# Patient Record
Sex: Male | Born: 1995 | Race: White | Hispanic: No | Marital: Single | Smoking: Current every day smoker
Health system: Southern US, Community
[De-identification: ages and names within clinical notes are randomized; demographics above are authoritative.]

## PROBLEM LIST (undated history)

## (undated) DIAGNOSIS — J45909 Unspecified asthma, uncomplicated: Secondary | ICD-10-CM

## (undated) HISTORY — PX: TYMPANOPLASTY: SHX33

## (undated) HISTORY — PX: TONSILLECTOMY: SUR1361

---

## 2013-02-20 ENCOUNTER — Emergency Department: Payer: Self-pay | Admitting: Emergency Medicine

## 2013-02-20 LAB — CBC
HCT: 47.6 % (ref 40.0–52.0)
MCH: 32 pg (ref 26.0–34.0)
MCV: 92 fL (ref 80–100)
Platelet: 324 10*3/uL (ref 150–440)
RBC: 5.15 10*6/uL (ref 4.40–5.90)
RDW: 12.3 % (ref 11.5–14.5)

## 2013-02-20 LAB — DRUG SCREEN, URINE
Amphetamines, Ur Screen: NEGATIVE (ref ?–1000)
Barbiturates, Ur Screen: NEGATIVE (ref ?–200)
Benzodiazepine, Ur Scrn: NEGATIVE (ref ?–200)
Cannabinoid 50 Ng, Ur ~~LOC~~: POSITIVE (ref ?–50)
Cocaine Metabolite,Ur ~~LOC~~: POSITIVE (ref ?–300)
MDMA (Ecstasy)Ur Screen: NEGATIVE (ref ?–500)
Methadone, Ur Screen: NEGATIVE (ref ?–300)
Phencyclidine (PCP) Ur S: NEGATIVE (ref ?–25)
Tricyclic, Ur Screen: NEGATIVE (ref ?–1000)

## 2013-02-20 LAB — ETHANOL
Ethanol %: 0.25 % — ABNORMAL HIGH
Ethanol: 250 mg/dL

## 2013-02-20 LAB — BASIC METABOLIC PANEL
BUN: 10 mg/dL (ref 9–21)
Chloride: 104 mmol/L (ref 97–107)
Co2: 25 mmol/L (ref 16–25)
Creatinine: 0.76 mg/dL (ref 0.60–1.30)
Glucose: 135 mg/dL — ABNORMAL HIGH (ref 65–99)
Osmolality: 275 (ref 275–301)
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
## 2015-06-11 ENCOUNTER — Emergency Department
Admission: EM | Admit: 2015-06-11 | Discharge: 2015-06-11 | Disposition: A | Discharge status: Home / Self Care | Payer: BLUE CROSS/BLUE SHIELD | Attending: Emergency Medicine | Admitting: Emergency Medicine

## 2015-06-11 ENCOUNTER — Encounter: Payer: Self-pay | Admitting: Emergency Medicine

## 2015-06-11 DIAGNOSIS — K922 Gastrointestinal hemorrhage, unspecified: Secondary | ICD-10-CM | POA: Insufficient documentation

## 2015-06-11 DIAGNOSIS — F17210 Nicotine dependence, cigarettes, uncomplicated: Secondary | ICD-10-CM | POA: Insufficient documentation

## 2015-06-11 DIAGNOSIS — K625 Hemorrhage of anus and rectum: Secondary | ICD-10-CM | POA: Diagnosis present

## 2015-06-11 HISTORY — DX: Unspecified asthma, uncomplicated: J45.909

## 2015-06-11 LAB — COMPREHENSIVE METABOLIC PANEL
ALT: 27 U/L (ref 17–63)
AST: 29 U/L (ref 15–41)
Albumin: 4.4 g/dL (ref 3.5–5.0)
Alkaline Phosphatase: 67 U/L (ref 38–126)
Anion gap: 5 (ref 5–15)
BILIRUBIN TOTAL: 0.5 mg/dL (ref 0.3–1.2)
BUN: 9 mg/dL (ref 6–20)
CALCIUM: 9.8 mg/dL (ref 8.9–10.3)
CO2: 29 mmol/L (ref 22–32)
CREATININE: 0.7 mg/dL (ref 0.61–1.24)
Chloride: 106 mmol/L (ref 101–111)
GFR calc Af Amer: >60 mL/min (ref >60)
Glucose, Bld: 103 mg/dL — ABNORMAL HIGH (ref 65–99)
Potassium: 3.3 mmol/L — ABNORMAL LOW (ref 3.5–5.1)
Sodium: 140 mmol/L (ref 135–145)
TOTAL PROTEIN: 7.5 g/dL (ref 6.5–8.1)

## 2015-06-11 LAB — CBC
HCT: 45.3 % (ref 40.0–52.0)
Hemoglobin: 15.9 g/dL (ref 13.0–18.0)
MCH: 31.7 pg (ref 26.0–34.0)
MCHC: 35.2 g/dL (ref 32.0–36.0)
MCV: 90.2 fL (ref 80.0–100.0)
PLATELETS: 309 10*3/uL (ref 150–440)
RBC: 5.03 MIL/uL (ref 4.40–5.90)
RDW: 12.6 % (ref 11.5–14.5)
WBC: 10 10*3/uL (ref 3.8–10.6)

## 2015-06-11 LAB — LIPASE, BLOOD: LIPASE: 40 U/L (ref 11–51)

## 2015-06-11 MED ORDER — PANTOPRAZOLE SODIUM 40 MG PO TBEC: 40.0000 mg | DELAYED_RELEASE_TABLET | Freq: Every day | ORAL | Status: AC

## 2015-06-11 NOTE — ED Notes (Addendum)
Patient ambulatory to triage with steady gait, without difficulty or distress noted; pt reports blood in stool today x 2 with "burning" to stomach; denies hx of same

## 2015-06-11 NOTE — ED Notes (Signed)
AAOx3.  Skin warm and dry.  NAD 

## 2015-06-11 NOTE — ED Provider Notes (Signed)
[facility] Emergency Department Provider Note    ____________________________________________  Time seen: 2140  I have reviewed the triage vital signs and the nursing notes.   HISTORY  Chief Complaint Rectal Bleeding   History limited by: Not Limited   HPI Eddie Willis is a 19 y.o. male who presents to the emergency department today because of concerns for rectal bleeding. The patient states he had 2 bowel movements today with some red blood. He thinks that the red blood might of been mixed in with the stool. He denied any excessive straining with his bowel movements today. Denied any rectal pain. States he had a little bit of abdominal burning. He has never had this happen to him in the past. He denies any recent fevers.   Past Medical History  Diagnosis Date  . Asthma     There are no active problems to display for this patient.   Past Surgical History  Procedure Laterality Date  . Tonsillectomy    . Tympanoplasty      No current outpatient prescriptions on file.  Allergies Morphine and related and Sulfa antibiotics  No family history on file.  Social History Social History  Substance Use Topics  . Smoking status: Current Every Day Smoker -- 1.00 packs/day    Types: Cigarettes  . Smokeless tobacco: None  . Alcohol Use: No    Review of Systems  Constitutional: Negative for fever. Cardiovascular: Negative for chest pain. Respiratory: Negative for shortness of breath. Gastrointestinal: Positive for rectal bleeding Genitourinary: Negative for dysuria. Musculoskeletal: Negative for back pain. Skin: Negative for rash. Neurological: Negative for headaches, focal weakness or numbness.  10-point ROS otherwise negative.  ____________________________________________   PHYSICAL EXAM:  VITAL SIGNS: ED Triage Vitals  Enc Vitals Group     BP 06/11/15 2030 152/86 mmHg     Pulse Rate 06/11/15 2030 84     Resp 06/11/15 2030 18      Temp 06/11/15 2030 97.7 F (36.5 C)     Temp Source 06/11/15 2030 Oral     SpO2 06/11/15 2030 95 %     Weight 06/11/15 2030 200 lb (90.719 kg)     Height 06/11/15 2030  (1.676 m)     Head Cir --      Peak Flow --      Pain Score 06/11/15 2030 2   Constitutional: Alert and oriented. Well appearing and in no distress. Eyes: Conjunctivae are normal. PERRL. Normal extraocular movements. ENT   Head: Normocephalic and atraumatic.   Nose: No congestion/rhinnorhea.   Mouth/Throat: Mucous membranes are moist.   Neck: No stridor. Hematological/Lymphatic/Immunilogical: No cervical lymphadenopathy. Cardiovascular: Normal rate, regular rhythm.  No murmurs, rubs, or gallops. Respiratory: Normal respiratory effort without tachypnea nor retractions. Breath sounds are clear and equal bilaterally. No wheezes/rales/rhonchi. Gastrointestinal: Soft and nontender. No distention. There is no CVA tenderness. Rectal: No red blood on glove. GUIAC positive.  Genitourinary: Deferred Musculoskeletal: Normal range of motion in all extremities. No joint effusions.  No lower extremity tenderness nor edema. Neurologic:  Normal speech and language. No gross focal neurologic deficits are appreciated.  Skin:  Skin is warm, dry and intact. No rash noted. Psychiatric: Mood and affect are normal. Speech and behavior are normal. Patient exhibits appropriate insight and judgment.  ____________________________________________    LABS (pertinent positives/negatives)  Labs Reviewed  COMPREHENSIVE METABOLIC PANEL - Abnormal; Notable for the following:    Potassium 3.3 (*)    Glucose, Bld 103 (*)  All other components within normal limits  CBC  LIPASE, BLOOD     ____________________________________________   EKG  None  ____________________________________________    RADIOLOGY  None   ____________________________________________   PROCEDURES  Procedure(s) performed:  None  Critical Care performed: No  ____________________________________________   INITIAL IMPRESSION / ASSESSMENT AND PLAN / ED COURSE  Pertinent labs & imaging results that were available during my care of the patient were reviewed by me and considered in my medical decision making (see chart for details).  Patient presents to the emergency department today with a primary concern of rectal bleeding. Patient's blood work without any concerning findings. Patient was guaiac positive however there is no bright red blood or blood on the glove. It was brown stool. This point given the patient's age I doubt a neoplastic cause. Additionally patient did not have any abdominal tenderness. I think likely patient either has some internal hemorrhoids that are bleeding or he has a mild form of colitis. I instructed patient to follow up with primary care will also give GI's number.  ____________________________________________   FINAL CLINICAL IMPRESSION(S) / ED DIAGNOSES  Final diagnoses:  Gastrointestinal hemorrhage, unspecified gastritis, unspecified gastrointestinal hemorrhage type     Phineas SemenGraydon Macaiah Mangal, MD 06/11/15 2220

## 2015-06-11 NOTE — Discharge Instructions (Signed)
Please seek medical attention for any high fevers, chest pain, shortness of breath, change in behavior, persistent vomiting, bloody stool or any other new or concerning symptoms. ° ° °Gastrointestinal Bleeding °Gastrointestinal (GI) bleeding means there is bleeding somewhere along the digestive tract, between the mouth and anus. °CAUSES  °There are many different problems that can cause GI bleeding. Possible causes include: °· Esophagitis. This is inflammation, irritation, or swelling of the esophagus. °· Hemorrhoids. These are veins that are full of blood (engorged) in the rectum. They cause pain, inflammation, and may bleed. °· Anal fissures. These are areas of painful tearing which may bleed. They are often caused by passing hard stool. °· Diverticulosis. These are pouches that form on the colon over time, with age, and may bleed significantly. °· Diverticulitis. This is inflammation in areas with diverticulosis. It can cause pain, fever, and bloody stools, although bleeding is rare. °· Polyps and cancer. Colon cancer often starts out as precancerous polyps. °· Gastritis and ulcers. Bleeding from the upper gastrointestinal tract (near the stomach) may travel through the intestines and produce black, sometimes tarry, often bad smelling stools. In certain cases, if the bleeding is fast enough, the stools may not be black, but red. This condition may be life-threatening. °SYMPTOMS  °· Vomiting bright red blood or material that looks like coffee grounds. °· Bloody, black, or tarry stools. °DIAGNOSIS  °Your caregiver may diagnose your condition by taking your history and performing a physical exam. More tests may be needed, including: °· X-rays and other imaging tests. °· Esophagogastroduodenoscopy (EGD). This test uses a flexible, lighted tube to look at your esophagus, stomach, and small intestine. °· Colonoscopy. This test uses a flexible, lighted tube to look at your colon. °TREATMENT  °Treatment depends on the  cause of your bleeding.  °· For bleeding from the esophagus, stomach, small intestine, or colon, the caregiver doing your EGD or colonoscopy may be able to stop the bleeding as part of the procedure. °· Inflammation or infection of the colon can be treated with medicines. °· Many rectal problems can be treated with creams, suppositories, or warm baths. °· Surgery is sometimes needed. °· Blood transfusions are sometimes needed if you have lost a lot of blood. °If bleeding is slow, you may be allowed to go home. If there is a lot of bleeding, you will need to stay in the hospital for observation. °HOME CARE INSTRUCTIONS  °· Take any medicines exactly as prescribed. °· Keep your stools soft by eating foods that are high in fiber. These foods include whole grains, legumes, fruits, and vegetables. Prunes (1 to 3 a day) work well for many people. °· Drink enough fluids to keep your urine clear or pale yellow. °SEEK IMMEDIATE MEDICAL CARE IF:  °· Your bleeding increases. °· You feel lightheaded, weak, or you faint. °· You have severe cramps in your back or abdomen. °· You pass large blood clots in your stool. °· Your problems are getting worse. °MAKE SURE YOU:  °· Understand these instructions. °· Will watch your condition. °· Will get help right away if you are not doing well or get worse. °  °This information is not intended to replace advice given to you by your health care provider. Make sure you discuss any questions you have with your health care provider. °  °Document Released: 07/11/2000 Document Revised: 06/30/2012 Document Reviewed: 01/01/2015 °[employer]vier Interactive Patient Education ©2016 [employer] ° °

## 2019-01-20 ENCOUNTER — Telehealth: Payer: Self-pay

## 2019-01-20 NOTE — Telephone Encounter (Signed)
Copied from [place] [phone]. Topic: Appointment Scheduling - Scheduling Inquiry for Clinic >> Jan 20, 2019  9:02 AM Pauline Good wrote: Reason for CRM:pt need an call back to sched a New Pt appt.

## 2019-02-11 ENCOUNTER — Ambulatory Visit: Payer: BC Managed Care – PPO | Admitting: Internal Medicine
